# Patient Record
Sex: Female | Born: 1935 | Race: White | Hispanic: No | Marital: Married
Health system: Southern US, Community
[De-identification: ages and names within clinical notes are randomized; demographics above are authoritative.]

---

## 2004-11-21 ENCOUNTER — Ambulatory Visit: Payer: Self-pay

## 2005-01-19 ENCOUNTER — Ambulatory Visit: Payer: Self-pay | Admitting: Gastroenterology

## 2005-05-19 ENCOUNTER — Ambulatory Visit: Payer: Self-pay | Admitting: Family Medicine

## 2005-12-25 ENCOUNTER — Ambulatory Visit: Payer: Self-pay | Admitting: Family Medicine

## 2006-04-29 ENCOUNTER — Other Ambulatory Visit: Payer: Self-pay

## 2006-04-29 ENCOUNTER — Emergency Department: Payer: Self-pay | Admitting: Emergency Medicine

## 2006-05-11 ENCOUNTER — Ambulatory Visit: Payer: Self-pay

## 2007-04-03 ENCOUNTER — Ambulatory Visit: Payer: Self-pay | Admitting: Family Medicine

## 2007-04-09 ENCOUNTER — Emergency Department: Payer: Self-pay | Admitting: Emergency Medicine

## 2007-04-10 ENCOUNTER — Other Ambulatory Visit: Payer: Self-pay

## 2007-05-17 ENCOUNTER — Inpatient Hospital Stay (HOSPITAL_COMMUNITY): Admission: RE | Admit: 2007-05-17 | Discharge: 2007-05-23 | Payer: Self-pay | Admitting: Orthopedic Surgery

## 2007-05-21 ENCOUNTER — Ambulatory Visit: Payer: Self-pay | Admitting: Surgery

## 2007-05-21 ENCOUNTER — Encounter (INDEPENDENT_AMBULATORY_CARE_PROVIDER_SITE_OTHER): Payer: Self-pay | Admitting: Orthopedic Surgery

## 2007-05-23 ENCOUNTER — Encounter: Payer: Self-pay | Admitting: Internal Medicine

## 2007-06-07 ENCOUNTER — Encounter: Payer: Self-pay | Admitting: Internal Medicine

## 2007-06-11 ENCOUNTER — Ambulatory Visit: Payer: Self-pay | Admitting: Internal Medicine

## 2007-07-08 ENCOUNTER — Encounter: Payer: Self-pay | Admitting: Internal Medicine

## 2008-04-07 ENCOUNTER — Ambulatory Visit: Payer: Self-pay | Admitting: Family Medicine

## 2008-08-24 ENCOUNTER — Ambulatory Visit: Payer: Self-pay | Admitting: Unknown Physician Specialty

## 2009-05-31 ENCOUNTER — Ambulatory Visit: Payer: Self-pay | Admitting: Family Medicine

## 2009-07-23 ENCOUNTER — Inpatient Hospital Stay (HOSPITAL_COMMUNITY): Admission: EM | Admit: 2009-07-23 | Discharge: 2009-07-25 | Payer: Self-pay | Admitting: Diagnostic Neuroimaging

## 2009-07-23 ENCOUNTER — Ambulatory Visit: Payer: Self-pay | Admitting: Cardiology

## 2009-07-23 ENCOUNTER — Encounter (INDEPENDENT_AMBULATORY_CARE_PROVIDER_SITE_OTHER): Payer: Self-pay | Admitting: Diagnostic Neuroimaging

## 2009-07-23 ENCOUNTER — Emergency Department: Payer: Self-pay | Admitting: Unknown Physician Specialty

## 2009-08-27 ENCOUNTER — Encounter: Payer: Self-pay | Admitting: Internal Medicine

## 2009-08-27 ENCOUNTER — Inpatient Hospital Stay (HOSPITAL_COMMUNITY): Admission: EM | Admit: 2009-08-27 | Discharge: 2009-08-30 | Payer: Self-pay | Admitting: Emergency Medicine

## 2009-08-27 ENCOUNTER — Encounter (INDEPENDENT_AMBULATORY_CARE_PROVIDER_SITE_OTHER): Payer: Self-pay | Admitting: Internal Medicine

## 2009-08-27 ENCOUNTER — Ambulatory Visit: Payer: Self-pay | Admitting: Cardiology

## 2009-08-30 ENCOUNTER — Ambulatory Visit: Payer: Self-pay | Admitting: Physical Medicine & Rehabilitation

## 2009-08-30 ENCOUNTER — Inpatient Hospital Stay (HOSPITAL_COMMUNITY)
Admission: RE | Admit: 2009-08-30 | Discharge: 2009-09-21 | Payer: Self-pay | Admitting: Physical Medicine & Rehabilitation

## 2009-09-03 ENCOUNTER — Ambulatory Visit: Payer: Self-pay | Admitting: Psychology

## 2009-09-21 ENCOUNTER — Encounter: Payer: Self-pay | Admitting: Internal Medicine

## 2009-10-07 ENCOUNTER — Encounter: Payer: Self-pay | Admitting: Internal Medicine

## 2009-10-22 ENCOUNTER — Encounter
Admission: RE | Admit: 2009-10-22 | Discharge: 2009-10-22 | Payer: Self-pay | Source: Home / Self Care | Attending: Physical Medicine & Rehabilitation | Admitting: Physical Medicine & Rehabilitation

## 2009-11-06 ENCOUNTER — Encounter: Payer: Self-pay | Admitting: Internal Medicine

## 2009-12-07 ENCOUNTER — Encounter: Payer: Self-pay | Admitting: Internal Medicine

## 2010-01-06 ENCOUNTER — Encounter: Payer: Self-pay | Admitting: Internal Medicine

## 2010-02-06 ENCOUNTER — Encounter: Payer: Self-pay | Admitting: Internal Medicine

## 2010-03-09 ENCOUNTER — Encounter: Payer: Self-pay | Admitting: Internal Medicine

## 2010-04-22 LAB — BASIC METABOLIC PANEL
BUN: 15 mg/dL (ref 6–23)
Calcium: 8.4 mg/dL (ref 8.4–10.5)
Calcium: 8.8 mg/dL (ref 8.4–10.5)
Creatinine, Ser: 0.55 mg/dL (ref 0.4–1.2)
Creatinine, Ser: 0.54 mg/dL (ref 0.4–1.2)
Creatinine, Ser: 0.56 mg/dL (ref 0.4–1.2)
Creatinine, Ser: 0.67 mg/dL (ref 0.4–1.2)
GFR calc non Af Amer: >60 mL/min (ref >60)
GFR calc non Af Amer: >60 mL/min (ref >60)
GFR calc non Af Amer: >60 mL/min (ref >60)
Glucose, Bld: 92 mg/dL (ref 70–99)
Glucose, Bld: 86 mg/dL (ref 70–99)
Glucose, Bld: 91 mg/dL (ref 70–99)
Glucose, Bld: 91 mg/dL (ref 70–99)
Potassium: 4 mEq/L (ref 3.5–5.1)
Potassium: 4.5 mEq/L (ref 3.5–5.1)
Sodium: 128 mEq/L — ABNORMAL LOW (ref 135–145)
Sodium: 135 mEq/L (ref 135–145)

## 2010-04-22 LAB — URINE MICROSCOPIC-ADD ON

## 2010-04-22 LAB — URINALYSIS, ROUTINE W REFLEX MICROSCOPIC
Bilirubin Urine: NEGATIVE
Glucose, UA: NEGATIVE mg/dL
Nitrite: NEGATIVE
Protein, ur: NEGATIVE mg/dL
Specific Gravity, Urine: 1.012 (ref 1.005–1.030)
Urobilinogen, UA: 0.2 mg/dL (ref 0.0–1.0)

## 2010-04-22 LAB — URINE CULTURE: Culture  Setup Time: 201108011731

## 2010-04-23 LAB — COMPREHENSIVE METABOLIC PANEL
ALT: 12 U/L (ref 0–35)
AST: 16 U/L (ref 0–37)
Albumin: 3 g/dL — ABNORMAL LOW (ref 3.5–5.2)
Albumin: 3.6 g/dL (ref 3.5–5.2)
Albumin: 3.6 g/dL (ref 3.5–5.2)
Alkaline Phosphatase: 66 U/L (ref 39–117)
Alkaline Phosphatase: 70 U/L (ref 39–117)
Alkaline Phosphatase: 78 U/L (ref 39–117)
BUN: 11 mg/dL (ref 6–23)
BUN: 8 mg/dL (ref 6–23)
BUN: 8 mg/dL (ref 6–23)
CO2: 24 mEq/L (ref 19–32)
CO2: 25 mEq/L (ref 19–32)
Calcium: 8.8 mg/dL (ref 8.4–10.5)
Calcium: 9 mg/dL (ref 8.4–10.5)
Chloride: 94 mEq/L — ABNORMAL LOW (ref 96–112)
Chloride: 97 mEq/L (ref 96–112)
Chloride: 98 mEq/L (ref 96–112)
Creatinine, Ser: 0.45 mg/dL (ref 0.4–1.2)
Creatinine, Ser: 0.48 mg/dL (ref 0.4–1.2)
Creatinine, Ser: 0.53 mg/dL (ref 0.4–1.2)
GFR calc Af Amer: >60 mL/min (ref >60)
GFR calc Af Amer: >60 mL/min (ref >60)
GFR calc non Af Amer: >60 mL/min (ref >60)
GFR calc non Af Amer: >60 mL/min (ref >60)
GFR calc non Af Amer: >60 mL/min (ref >60)
Glucose, Bld: 131 mg/dL — ABNORMAL HIGH (ref 70–99)
Potassium: 3.2 mEq/L — ABNORMAL LOW (ref 3.5–5.1)
Potassium: 3.3 mEq/L — ABNORMAL LOW (ref 3.5–5.1)
Potassium: 3.4 mEq/L — ABNORMAL LOW (ref 3.5–5.1)
Sodium: 125 mEq/L — ABNORMAL LOW (ref 135–145)
Sodium: 132 mEq/L — ABNORMAL LOW (ref 135–145)
Total Bilirubin: 0.5 mg/dL (ref 0.3–1.2)
Total Bilirubin: 0.6 mg/dL (ref 0.3–1.2)
Total Protein: 6.5 g/dL (ref 6.0–8.3)
Total Protein: 6.6 g/dL (ref 6.0–8.3)

## 2010-04-23 LAB — BASIC METABOLIC PANEL
BUN: 12 mg/dL (ref 6–23)
CO2: 24 mEq/L (ref 19–32)
CO2: 27 mEq/L (ref 19–32)
Calcium: 8 mg/dL — ABNORMAL LOW (ref 8.4–10.5)
Calcium: 8.2 mg/dL — ABNORMAL LOW (ref 8.4–10.5)
Calcium: 8.3 mg/dL — ABNORMAL LOW (ref 8.4–10.5)
Chloride: 94 mEq/L — ABNORMAL LOW (ref 96–112)
Creatinine, Ser: 0.5 mg/dL (ref 0.4–1.2)
Creatinine, Ser: 0.53 mg/dL (ref 0.4–1.2)
Creatinine, Ser: 0.53 mg/dL (ref 0.4–1.2)
GFR calc Af Amer: >60 mL/min (ref >60)
GFR calc Af Amer: >60 mL/min (ref >60)
GFR calc Af Amer: >60 mL/min (ref >60)
GFR calc non Af Amer: >60 mL/min (ref >60)
GFR calc non Af Amer: >60 mL/min (ref >60)
Glucose, Bld: 110 mg/dL — ABNORMAL HIGH (ref 70–99)
Potassium: 3.3 mEq/L — ABNORMAL LOW (ref 3.5–5.1)
Sodium: 125 mEq/L — ABNORMAL LOW (ref 135–145)
Sodium: 126 mEq/L — ABNORMAL LOW (ref 135–145)

## 2010-04-23 LAB — APTT
aPTT: 28 seconds (ref 24–37)
aPTT: 29 seconds (ref 24–37)

## 2010-04-23 LAB — URINALYSIS, MICROSCOPIC ONLY
Bilirubin Urine: NEGATIVE
Glucose, UA: 100 mg/dL — AB
Nitrite: POSITIVE — AB
Specific Gravity, Urine: 1.014 (ref 1.005–1.030)
pH: 6.5 (ref 5.0–8.0)

## 2010-04-23 LAB — HEMOGLOBIN A1C
Hgb A1c MFr Bld: 5.8 % — ABNORMAL HIGH (ref <5.7)
Mean Plasma Glucose: 120 mg/dL — ABNORMAL HIGH (ref <117)

## 2010-04-23 LAB — DIFFERENTIAL
Basophils Absolute: 0 10*3/uL (ref 0.0–0.1)
Basophils Relative: 1 % (ref 0–1)
Eosinophils Absolute: 0.3 10*3/uL (ref 0.0–0.7)
Eosinophils Relative: 2 % (ref 0–5)
Eosinophils Relative: 8 % — ABNORMAL HIGH (ref 0–5)
Lymphocytes Relative: 15 % (ref 12–46)
Lymphs Abs: 1.4 10*3/uL (ref 0.7–4.0)
Neutrophils Relative %: 65 % (ref 43–77)

## 2010-04-23 LAB — CBC
HCT: 34.6 % — ABNORMAL LOW (ref 36.0–46.0)
HCT: 35.2 % — ABNORMAL LOW (ref 36.0–46.0)
HCT: 38.2 % (ref 36.0–46.0)
Hemoglobin: 11.8 g/dL — ABNORMAL LOW (ref 12.0–15.0)
Hemoglobin: 11.9 g/dL — ABNORMAL LOW (ref 12.0–15.0)
MCH: 31.1 pg (ref 26.0–34.0)
MCH: 31.2 pg (ref 26.0–34.0)
MCHC: 32.8 g/dL (ref 30.0–36.0)
MCHC: 33.9 g/dL (ref 30.0–36.0)
MCV: 91.8 fL (ref 78.0–100.0)
MCV: 92.2 fL (ref 78.0–100.0)
MCV: 92.7 fL (ref 78.0–100.0)
Platelets: 271 10*3/uL (ref 150–400)
Platelets: 280 10*3/uL (ref 150–400)
Platelets: 292 10*3/uL (ref 150–400)
RBC: 3.78 MIL/uL — ABNORMAL LOW (ref 3.87–5.11)
RBC: 3.81 MIL/uL — ABNORMAL LOW (ref 3.87–5.11)
RDW: 14.4 % (ref 11.5–15.5)
WBC: 11.6 10*3/uL — ABNORMAL HIGH (ref 4.0–10.5)
WBC: 12.2 10*3/uL — ABNORMAL HIGH (ref 4.0–10.5)

## 2010-04-23 LAB — URINE CULTURE
Colony Count: >=100000
Culture  Setup Time: 201107250925
Special Requests: POSITIVE

## 2010-04-23 LAB — SODIUM, URINE, RANDOM: Sodium, Ur: 66 mEq/L

## 2010-04-23 LAB — URINALYSIS, ROUTINE W REFLEX MICROSCOPIC
Bilirubin Urine: NEGATIVE
Nitrite: POSITIVE — AB
Protein, ur: NEGATIVE mg/dL
Urobilinogen, UA: 0.2 mg/dL (ref 0.0–1.0)

## 2010-04-23 LAB — GLUCOSE, CAPILLARY: Glucose-Capillary: 145 mg/dL — ABNORMAL HIGH (ref 70–99)

## 2010-04-23 LAB — OSMOLALITY
Osmolality: 257 mOsm/kg — ABNORMAL LOW (ref 275–300)
Osmolality: 267 mOsm/kg — ABNORMAL LOW (ref 275–300)

## 2010-04-23 LAB — CK TOTAL AND CKMB (NOT AT ARMC): CK, MB: 2.8 ng/mL (ref 0.3–4.0)

## 2010-04-23 LAB — PROTIME-INR
INR: 0.97 (ref 0.00–1.49)
Prothrombin Time: 12.8 seconds (ref 11.6–15.2)

## 2010-04-23 LAB — LIPID PANEL
Cholesterol: 134 mg/dL (ref 0–200)
HDL: 70 mg/dL (ref >39)

## 2010-04-23 LAB — CARDIAC PANEL(CRET KIN+CKTOT+MB+TROPI)
CK, MB: 2.7 ng/mL (ref 0.3–4.0)
Relative Index: INVALID (ref 0.0–2.5)
Troponin I: 0.01 ng/mL (ref 0.00–0.06)

## 2010-04-23 LAB — OSMOLALITY, URINE: Osmolality, Ur: 458 mOsm/kg (ref 390–1090)

## 2010-04-24 LAB — LIPID PANEL
HDL: 55 mg/dL (ref >39)
Total CHOL/HDL Ratio: 2.1 RATIO
Triglycerides: 63 mg/dL (ref <150)

## 2010-04-24 LAB — CBC
HCT: 33.8 % — ABNORMAL LOW (ref 36.0–46.0)
HCT: 36.2 % (ref 36.0–46.0)
Hemoglobin: 11.3 g/dL — ABNORMAL LOW (ref 12.0–15.0)
Hemoglobin: 12.3 g/dL (ref 12.0–15.0)
MCHC: 33.5 g/dL (ref 30.0–36.0)
MCHC: 33.9 g/dL (ref 30.0–36.0)
MCV: 91.6 fL (ref 78.0–100.0)
MCV: 92.6 fL (ref 78.0–100.0)
Platelets: 259 10*3/uL (ref 150–400)
RBC: 3.65 MIL/uL — ABNORMAL LOW (ref 3.87–5.11)
RBC: 3.95 MIL/uL (ref 3.87–5.11)
RDW: 14.9 % (ref 11.5–15.5)
WBC: 10 10*3/uL (ref 4.0–10.5)
WBC: 11 10*3/uL — ABNORMAL HIGH (ref 4.0–10.5)

## 2010-04-24 LAB — COMPREHENSIVE METABOLIC PANEL
Albumin: 3.3 g/dL — ABNORMAL LOW (ref 3.5–5.2)
BUN: 12 mg/dL (ref 6–23)
Chloride: 101 mEq/L (ref 96–112)
Creatinine, Ser: 0.62 mg/dL (ref 0.4–1.2)
GFR calc non Af Amer: >60 mL/min (ref >60)
Glucose, Bld: 106 mg/dL — ABNORMAL HIGH (ref 70–99)
Total Bilirubin: 0.6 mg/dL (ref 0.3–1.2)

## 2010-04-24 LAB — BASIC METABOLIC PANEL
CO2: 26 mEq/L (ref 19–32)
Chloride: 103 mEq/L (ref 96–112)
GFR calc Af Amer: >60 mL/min (ref >60)
Potassium: 3.8 mEq/L (ref 3.5–5.1)
Sodium: 134 mEq/L — ABNORMAL LOW (ref 135–145)

## 2010-04-24 LAB — CARDIAC PANEL(CRET KIN+CKTOT+MB+TROPI)
Relative Index: INVALID (ref 0.0–2.5)
Troponin I: 0.04 ng/mL (ref 0.00–0.06)
Troponin I: 0.06 ng/mL (ref 0.00–0.06)

## 2010-04-24 LAB — GLUCOSE, CAPILLARY: Glucose-Capillary: 94 mg/dL (ref 70–99)

## 2010-04-24 LAB — HEMOGLOBIN A1C
Hgb A1c MFr Bld: 6 % — ABNORMAL HIGH (ref <5.7)
Mean Plasma Glucose: 126 mg/dL — ABNORMAL HIGH (ref <117)

## 2010-04-24 LAB — PROTIME-INR: Prothrombin Time: 13.3 seconds (ref 11.6–15.2)

## 2010-06-21 NOTE — Op Note (Signed)
NAME:  Melissa Ellison, Melissa Ellison                 ACCOUNT NO.:  [id number]   MEDICAL RECORD NO.:  [id number]          PATIENT TYPE:  INP   LOCATION:  0001                         FACILITY:  [facility]   PHYSICIAN:  John L. Rendall, M.D.  DATE OF BIRTH:  1926-12-30   DATE OF PROCEDURE:  05/17/2007  DATE OF DISCHARGE:                               OPERATIVE REPORT   PREOPERATIVE DIAGNOSIS:  Avascular necrosis medial femoral condyle right  knee.   SURGICAL PROCEDURES:  Right total knee replacement with computer  navigation assistance.   POSTOPERATIVE DIAGNOSIS:  Avascular necrosis medial femoral condyle  right knee.   SURGEON:  John L. Rendall, M.D.   ASSISTANT:  Legrand Pitts. Duffy, P.A.   ANESTHESIA:  General.   PATHOLOGY:  The patient has a 1.5 x 2.5 cm defect of avascular necrosis  on the surface of the medial femoral condyle.  When all bone cuts were  made there appeared to be good bleeding bone beneath this defect but the  bone appeared to be soft.   PROCEDURE:  Under general anesthesia the right leg was prepared with  DuraPrep and draped as a sterile field.  Legs wrapped out with an  Esmarch and the tourniquet is used at 300 mm.  A medial parapatellar  incision was made.  The knee is debrided in preparation for computer  mapping.  The Schanz pins were then placed in the superior medial tibia  and the inferior medial femur.  The arrays were set up, the femoral head  is found.  The medial and lateral malleolus are found and the proximal  tibia and distal femur are mapped.  Once this is completed, the first  computer guide is used for proximal tibial resection.  This is done  within 1 degree of anatomic accuracy.  The tensioner was then inserted  and the ligaments are balanced.  Once this is measured in flexion and  extension, the first femoral cut was made on the anterior posterior  distal femur.  The second cut was then made on the distal femur with  what are at first balanced flexion and  extension gaps thought to be 12.5  on the computer, but upon putting in the spacer block, 15 fits properly.  The knee is then further debrided, removing meniscal remnants, cruciate  ligaments and taking spurs off the back of the femoral condyle.  Once  this was completed, the recessing guide is used and the proximal tibia  was exposed.  It was sized to a #4.  A center peg hole with keel is  inserted.  The standard plus femoral component was then inserted over a  15 bearing.  This was found to go into a more hyperextension than  desirable and going to a 17.6 bearing partially corrects it but if the  knee is brought into flexion and extension the soft tissues were  obviously stretching. A 20-mm bearing brought the knee within 2-1/2  degrees of anatomic alignment and failed to go into hyperextension,  stopping at near zero for extension.  At this point the bony surfaces  were prepared  with pulse irrigation.  All components were cemented in  place. After the cement was hardened and the tourniquet is let down the  knee was flexed and excess cement was removed in hyperflexion to get all  excess bone cement out of the knee.  There is a cracking sound heard and  avulsion occurred of the MCL with bone flake involving up to 40% of the  medial femoral condyle.  This is reduced anatomically. The Synthes  cannulated screw set 4.5 mm is obtained and two transverse screws 160  and 165 mm lag screws with washer are placed in this fragment.  Once  they are in place in a transverse mode perpendicular to the line of pull  of the MCL the knee is tested and as long as the knee does not flex past  90 degrees there does not appear to be any significant over pressure on  this. At this point the multiple small vessels were cauterized.  The  tourniquet was let down at an hour and 10 minutes.  The medium  Hemovac drain was inserted.  The knee was closed with #1 Tycron, #1  Vicryl, 2-0 Vicryl and skin clips.  The  patient returned to recovery in  good condition.  She will go to protected weightbearing and use of a  knee immobilizer while up and about as a precaution in view of the  fracture.      John L. Rendall, M.D.  Electronically Signed     JLR/MEDQ  D:  05/17/2007  T:  05/17/2007  Job:  [id number]

## 2010-06-21 NOTE — Discharge Summary (Signed)
NAME:  Melissa Ellison, Melissa Ellison                 ACCOUNT NO.:  [id number]   MEDICAL RECORD NO.:  [id number]          PATIENT TYPE:  INP   LOCATION:  1618                         FACILITY:  [facility]   PHYSICIAN:  John L. Rendall, M.D.  DATE OF BIRTH:  Dec 19, 1926   DATE OF ADMISSION:  05/17/2007  DATE OF DISCHARGE:  05/22/2007                               DISCHARGE SUMMARY   ADMISSION DIAGNOSES:  1. End-stage osteoarthritis, avascular necrosis, medial femoral      condyle right knee.  2. Osteoarthritis bilateral shoulders.  3. Hypertension.  4. Aortic stenosis.  5. Achalasia.  6. Hypercholesterolemia.  7. History of vertigo.  8. Deaf right ear.   DISCHARGE DIAGNOSES:  1. End-stage osteoarthritis, osteonecrosis medial femoral condyle      right knee status post right total knee arthroplasty.  2. Partial avulsion medial femoral condyle status post ORIF.  3. Acute blood loss anemia secondary to surgery.  4. Hyponatremia probable acute on chronic.  5. Hypokalemia mild.  6. Constipation.  7. Osteoarthritis bilateral shoulders.  8. Hypertension.  9. Aortic stenosis.  10.Achalasia.  11.Hypercholesterolemia.  12.History of vertigo.  13.Deaf right ear.   SURGICAL PROCEDURES:  On May 17, 2007 [name] underwent a right  total knee arthroplasty with computer navigation by Dr. Jonny Ruiz L.  Rendall  assisted by Arnoldo Morale, PA-C.  She also had repair of partial avulsion  of the MCL off the medial femoral condyle with a 4.5 leg screw.  She had  a DePuy LCS complete metal back patella cemented size standard plus  placed with an LCS complete RP insert size standard plus 20 mm  thickness.  An LCS complete primary femoral component cemented size  standard plus right and a DePuy MBP keel tibial tray cemented size #4.   COMPLICATIONS:  The only complication was the avulsion of the bone  fragment with the MCL when knee flexed, and that was fixed  intraoperatively.   CONSULTANTS:  1. Physical therapy  consult May 18, 2007.  2. Case management consult April 13 in addition to occupational      therapy consult.   HISTORY OF PRESENT ILLNESS:  This 77 year old white female patient  presented Dr. Priscille Kluver with a five month history of sudden onset of  progressive right knee pain.  The pain is a constant ache over the  medial joint line with occasional radiation into the hip.  It increases  with weightbearing and decreases with rest and Ultracet.  The knee  catches, pops, swells, and keeps her up at night.  She has failed  conservative treatment, and x-rays show arthritic changes of the knee  and avascular necrosis of the medial femoral condyle.  Because of this  she is presenting for right knee replacement.   HOSPITAL COURSE:  [name] tolerated her surgical procedure well  without immediate postoperative complications.  She was transferred to  the orthopedic floor.  Postop day #1 she had some lightheadedness, a  little bit of nausea.  She was afebrile, but her potassium was low at  2.8.  Sodium was 128.  Hemoglobin was 9,  hematocrit 27.  Potassium was  replaced. Hyponatremia was monitored, and she was transfused with 2  units of packed red blood cells.  She tolerated that well.   On postop day #2 she was feeling a little bit better but some nausea  with therapy.  Hemoglobin had improved to 10, hematocrit 29.  Sodium had  dropped to 126 so she was placed on a fluid restriction.  Potassium was  only 3.4, and that was supplemented.  She was started on therapy per  protocol.  Foley DC then switched to p.o. pain meds.   She continued to make slow progress over the next several days.  Pain  was well-controlled.  Sodium remained low around the 130 range even with  fluid restriction.  She had some complaints of GI upset, and a KUB was  ordered which showed no signs of ileus.  On the 14th T-max was 100.2,  vitals were stable.  Hemoglobin 10.8, hematocrit 31.3. Sodium 130.  She  did complain  of some calf pain so Doppler was ordered which was negative  for DVT.  She was actually making better progress with therapy, and they  felt with another day or so she would be ready for DC home.   On April 15 T-max was 99.2, vitals stable.  Sodium 129, potassium 3.4.  Leg is neurovascularly intact.  Incision well-approximated with staples.  No drainage.  It is felt she is ready for DC home and will be DC'd home  later today.   DISCHARGE DIET:  Diet:  She is to resume her regular prehospitalization  diet.   MEDICATIONS:  She is to resume her home meds as follows:   1. Evista 60 mg p.o. q.a.m..  2. Caduet 5/20 mg one tablet p.o. q.a.m.  3. Tramadol she is to hold at this time while on Percocet for pain.  4. Aspirin she is to hold at this time while on Arixtra.  5. Diclofenac she is to hold at this time on blood thinners.  6. Niacin 500 mg p.o. q.h.s.  7. V-C Forte one tablet p.o. q.a.m.  8. Calcium 600 Plus D p.o. b.i.d.  9. Hyzaar 100/25 mg p.o. q.a.m.  10.GlycoLax 17 g in 8 oz of water q.3 days.   ADDITIONAL MEDICATIONS:  1. Arixtra 2.5 mg subcu q.8 a.m. with the last dose on April 17.  On      the 18th she is to resume her baby aspirin a day.  2. Percocet 5/325 1-2 tablets p.o. q.4 h. p.r.n. for pain, 60 with no      refill.  3. Robaxin 500 mg 1-2 tablets p.o. q.6 h. p.r.n. for spasms, 50 with      no refill.   ACTIVITY:  She can be out of bed partial weightbearing 25-50% or less on  the right leg with use of the walker.  She is to have the knee  immobilizer in place on the right leg with walking.  No lifting or  driving for six weeks.  She is to have home CPM only 0 to 70 degrees at  this time.  We do not want supreme flexion with a history of that  fracture.  Please see the blue total knee discharge sheet for further  activity instructions.   WOUND CARE:  Please clean the incision with Betadine daily and apply dry  dressing.  She may shower after no drainage from the  wound for two days.  Please see the blue total knee discharge  sheet for further wound care  instructions.   FOLLOWUP:  She is to follow up with Dr. Priscille Kluver in our office on  Tuesday, April 21, and needs to call [phone] for that appointment.  She  is going to have a repeat BMET done on Friday, April 17, to check her  sodium level.  She is to continue to limit her fluids at this time to  about a liter a day, and maybe add a small amount of salt to her diet.   LABORATORY DATA:  On May 09, 2007 white count was 11.2, hemoglobin 14,  hematocrit 41.5, platelets 390.  Hemoglobin 9.6, hematocrit 27.7 on the  11th and then on April 14 it was 10.8 and 31.3.  White count went to a  high of 14.8 on the 11th and on April 14 was 10.8.  Platelets remained  within normal limits.   On April 2 sodium was 132, potassium 3.8, chloride 93, glucose 101.  On  April 11 sodium was 128 and then went to 126 on the 11th and 12th, then  to 130 on the 13th and 14th, and then 129 on the 15th.  Potassium went  from 2.8 on the 11th to 4.5 on the 13th to 3.4 on the 15th.  Sodium  ranged from 93 on the 11th to 96 on the 12th.  BUN and creatinine  remained basically within normal limits with a BUN dropped to a low of 4  and 5 on the 13th and 14th.  Glucose ranged from 173 on the 11th to 106  on the 15th.   Calcium went from 7.9 on the 11th to 8.8 on the 15th.  All other  laboratory studies were within normal limits.   Chest x-ray taken on April 2 showed no acute cardiopulmonary process  with scattered benign calcifications in the lungs and severe  degenerative changes of glenohumeral joint.  X-ray taken of the right  knee on April 10 showed the right total knee arthroplasty with no  adverse features and prior hardware removed from the distal femur and  proximal tibia with 2 cortical screws traversing the distal right  femoral condyle medial to lateral.  KUB done on April 13 showed gas  pattern within normal limits  for supine radiographs.  Lower extremity  venous Doppler done on April 14 showed no signs of DVT, SVT or Baker's  cyst.      Legrand Pitts. Duffy, P.A.      John L. Rendall, M.D.  Electronically Signed    KED/MEDQ  D:  05/22/2007  T:  05/22/2007  Job:  [id number]

## 2010-11-01 LAB — BASIC METABOLIC PANEL
BUN: 5 — ABNORMAL LOW
BUN: 7
CO2: 29
CO2: 31
CO2: 31
Calcium: 7.9 — ABNORMAL LOW
Calcium: 8.2 — ABNORMAL LOW
Calcium: 8.9
Calcium: 8.7
Calcium: 8.8
Chloride: 94 — ABNORMAL LOW
Chloride: 95 — ABNORMAL LOW
Chloride: 96
Creatinine, Ser: 0.45
Creatinine, Ser: 0.46
Creatinine, Ser: 0.53
Creatinine, Ser: 0.6
Creatinine, Ser: 0.49
GFR calc Af Amer: >60
GFR calc Af Amer: >60
GFR calc Af Amer: >60
GFR calc Af Amer: >60
GFR calc Af Amer: >60
GFR calc non Af Amer: >60
GFR calc non Af Amer: >60
GFR calc non Af Amer: >60
GFR calc non Af Amer: >60
GFR calc non Af Amer: >60
Glucose, Bld: 109 — ABNORMAL HIGH
Glucose, Bld: 173 — ABNORMAL HIGH
Glucose, Bld: 98
Potassium: 3.4 — ABNORMAL LOW
Potassium: 3.9
Potassium: 4.5
Potassium: 3.7
Sodium: 126 — ABNORMAL LOW
Sodium: 128 — ABNORMAL LOW
Sodium: 129 — ABNORMAL LOW
Sodium: 129 — ABNORMAL LOW

## 2010-11-01 LAB — CROSSMATCH
ABO/RH(D): O POS
Antibody Screen: NEGATIVE

## 2010-11-01 LAB — COMPREHENSIVE METABOLIC PANEL
AST: 25
Albumin: 3.8
CO2: 32
Calcium: 9.9
Creatinine, Ser: 0.58
GFR calc Af Amer: >60
GFR calc non Af Amer: >60

## 2010-11-01 LAB — CBC
HCT: 29.8 — ABNORMAL LOW
HCT: 29.9 — ABNORMAL LOW
MCHC: 33.7
MCHC: 34.4
MCHC: 35.2
MCV: 90
MCV: 91.3
MCV: 91.5
MCV: 91.9
Platelets: 271
Platelets: 279
Platelets: 390
RBC: 3.25 — ABNORMAL LOW
RBC: 3.32 — ABNORMAL LOW
RBC: 3.43 — ABNORMAL LOW
RDW: 13.2
RDW: 13.3
RDW: 13.8
WBC: 12.5 — ABNORMAL HIGH
WBC: 12.7 — ABNORMAL HIGH

## 2010-11-01 LAB — DIFFERENTIAL
Eosinophils Relative: 2
Lymphocytes Relative: 7 — ABNORMAL LOW
Lymphs Abs: 0.8

## 2010-11-01 LAB — PROTIME-INR: Prothrombin Time: 12.8

## 2010-11-01 LAB — URINALYSIS, ROUTINE W REFLEX MICROSCOPIC
Bilirubin Urine: NEGATIVE
Hgb urine dipstick: NEGATIVE
Specific Gravity, Urine: 1.009
pH: 7.5

## 2010-11-01 LAB — URINE CULTURE: Special Requests: NEGATIVE

## 2010-11-01 LAB — URINE MICROSCOPIC-ADD ON

## 2011-03-15 ENCOUNTER — Ambulatory Visit: Payer: Self-pay | Admitting: Family Medicine

## 2011-12-22 IMAGING — CT CT ANGIO HEAD
1 of 8 series · 2 of 35 positions shown · IV contrast (APPLIED)
Comparison: CT 08/27/2009

CLINICAL DATA: Code stroke.  Left-sided weakness.

CT ANGIOGRAPHY HEAD
TECHNIQUE: Multidetector CT imaging of the head was performed
using the standard protocol during bolus administration of
intravenous contrast.  Multiplanar CT image reconstructions
including MIPs were obtained to evaluate the vascular anatomy.
Contrast:  100 ml Omnipaque 350 IV

[Series 6: angio 2mm · axial · 0.43mm/px · z∈[-50,+10]mm · 2 of 92 slices shown]
[im 31/92  soft-tissue]
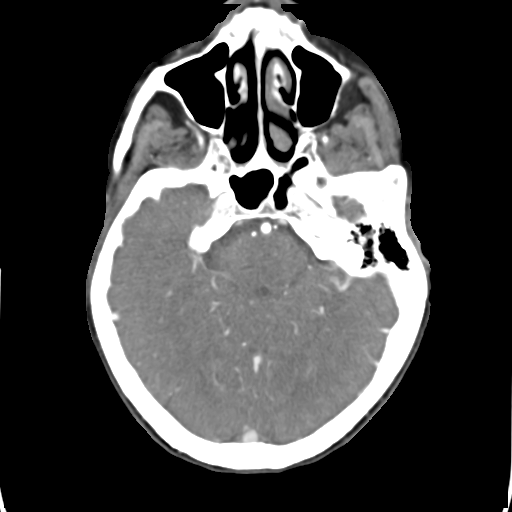
[im 61/92  bone]
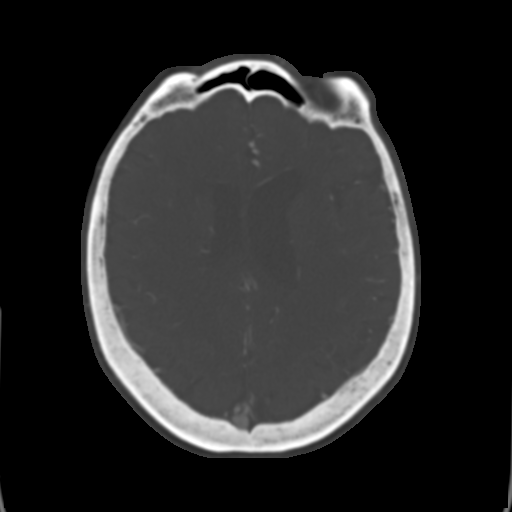

[2 of 35 positions shown; findings below may reference images not displayed]

FINDINGS: Postcontrast images of the brain reveal no enhancing
lesions.  There is atrophy and moderate chronic microvascular
ischemia in the white matter.  There is no mass lesion.

There is moderate to advanced atherosclerotic calcification
throughout the cavernous carotid bilaterally causing moderate
cavernous carotid stenosis bilaterally.  The anterior and middle
cerebral arteries are patent bilaterally with mild atherosclerotic
disease in a branch arteries.

There is atherosclerotic calcification in the vertebral arteries
bilaterally which are both patent.  PICA, and 90, superior
cerebellar, and posterior cerebral arteries are widely patent.
There is mild atherosclerotic irregularity in the posterior
cerebral arteries bilaterally without significant stenosis.

There is no vascular malformation or aneurysm.

Major dural sinuses are widely patent without thrombosis or
occlusion.

 Review of the MIP images confirms the above findings.
IMPRESSION: Intracranial atherosclerotic disease as above.  No large vessel
occlusion.

## 2012-04-24 ENCOUNTER — Ambulatory Visit: Payer: Self-pay | Admitting: Family Medicine

## 2012-11-17 ENCOUNTER — Other Ambulatory Visit: Payer: Self-pay | Admitting: Family Medicine

## 2012-11-17 LAB — URINALYSIS, COMPLETE
Bacteria: NONE SEEN
Blood: NEGATIVE
Glucose,UR: NEGATIVE mg/dL (ref 0–75)
Ketone: NEGATIVE
Leukocyte Esterase: NEGATIVE
Nitrite: NEGATIVE
Ph: 9 (ref 4.5–8.0)
Protein: NEGATIVE
RBC,UR: NONE SEEN /HPF (ref 0–5)
Squamous Epithelial: <1

## 2012-11-19 LAB — URINE CULTURE

## 2012-12-10 ENCOUNTER — Other Ambulatory Visit: Payer: Self-pay | Admitting: Family Medicine

## 2012-12-11 ENCOUNTER — Inpatient Hospital Stay: Payer: Self-pay | Admitting: Internal Medicine

## 2012-12-11 LAB — CK TOTAL AND CKMB (NOT AT ARMC): CK, Total: 49 U/L (ref 21–215)

## 2012-12-11 LAB — APTT: Activated PTT: 32.6 secs (ref 23.6–35.9)

## 2012-12-11 LAB — CBC WITH DIFFERENTIAL/PLATELET
Basophil #: 0 10*3/uL (ref 0.0–0.1)
Basophil %: 0.3 %
HGB: 14.5 g/dL (ref 12.0–16.0)
Lymphocyte %: 8.3 %
MCH: 30.8 pg (ref 26.0–34.0)
MCHC: 35 g/dL (ref 32.0–36.0)
Neutrophil #: 7 10*3/uL — ABNORMAL HIGH (ref 1.4–6.5)
Neutrophil %: 80.4 %
Platelet: 240 10*3/uL (ref 150–440)
RBC: 4.7 10*6/uL (ref 3.80–5.20)
RDW: 14.4 % (ref 11.5–14.5)

## 2012-12-11 LAB — PROTIME-INR: Prothrombin Time: 13 secs (ref 11.5–14.7)

## 2012-12-11 LAB — MAGNESIUM: Magnesium: 1.5 mg/dL — ABNORMAL LOW

## 2012-12-11 LAB — COMPREHENSIVE METABOLIC PANEL
Calcium, Total: 9.8 mg/dL (ref 8.5–10.1)
Chloride: 102 mmol/L (ref 98–107)
Co2: 26 mmol/L (ref 21–32)
Creatinine: 0.66 mg/dL (ref 0.60–1.30)
EGFR (African American): >60
Potassium: 3.6 mmol/L (ref 3.5–5.1)
SGOT(AST): 30 U/L (ref 15–37)
Sodium: 134 mmol/L — ABNORMAL LOW (ref 136–145)

## 2012-12-11 LAB — PRO B NATRIURETIC PEPTIDE: B-Type Natriuretic Peptide: 31954 pg/mL — ABNORMAL HIGH (ref 0–450)

## 2012-12-11 LAB — LIPASE, BLOOD: Lipase: 37 U/L — ABNORMAL LOW (ref 73–393)

## 2012-12-12 LAB — LIPID PANEL
Cholesterol: 116 mg/dL (ref 0–200)
HDL Cholesterol: 83 mg/dL — ABNORMAL HIGH (ref 40–60)
Ldl Cholesterol, Calc: 22 mg/dL (ref 0–100)
Triglycerides: 53 mg/dL (ref 0–200)
VLDL Cholesterol, Calc: 11 mg/dL (ref 5–40)

## 2012-12-12 LAB — CBC WITH DIFFERENTIAL/PLATELET
Basophil %: 0.5 %
Eosinophil #: 0.4 10*3/uL (ref 0.0–0.7)
Eosinophil %: 4.8 %
HCT: 39.1 % (ref 35.0–47.0)
Lymphocyte %: 13.8 %
MCV: 89 fL (ref 80–100)
Monocyte %: 19.4 %
Platelet: 218 10*3/uL (ref 150–440)
RBC: 4.38 10*6/uL (ref 3.80–5.20)
RDW: 14 % (ref 11.5–14.5)
WBC: 7.9 10*3/uL (ref 3.6–11.0)

## 2012-12-12 LAB — URINALYSIS, COMPLETE
Bacteria: NONE SEEN
Blood: NEGATIVE
Leukocyte Esterase: NEGATIVE
Nitrite: NEGATIVE
Ph: 6 (ref 4.5–8.0)
Protein: NEGATIVE
RBC,UR: <1 /HPF (ref 0–5)
Squamous Epithelial: NONE SEEN
WBC UR: 1 /HPF (ref 0–5)

## 2012-12-12 LAB — BASIC METABOLIC PANEL
Anion Gap: 4 — ABNORMAL LOW (ref 7–16)
BUN: 12 mg/dL (ref 7–18)
EGFR (African American): >60
EGFR (Non-African Amer.): >60
Sodium: 133 mmol/L — ABNORMAL LOW (ref 136–145)

## 2012-12-13 LAB — BASIC METABOLIC PANEL
Anion Gap: 5 — ABNORMAL LOW (ref 7–16)
BUN: 21 mg/dL — ABNORMAL HIGH (ref 7–18)
Calcium, Total: 8.9 mg/dL (ref 8.5–10.1)
Chloride: 101 mmol/L (ref 98–107)
EGFR (Non-African Amer.): 57 — ABNORMAL LOW
Glucose: 82 mg/dL (ref 65–99)
Sodium: 135 mmol/L — ABNORMAL LOW (ref 136–145)

## 2012-12-16 LAB — CULTURE, BLOOD (SINGLE)

## 2012-12-19 ENCOUNTER — Other Ambulatory Visit: Payer: Self-pay

## 2012-12-20 ENCOUNTER — Other Ambulatory Visit: Payer: Self-pay | Admitting: Family Medicine

## 2012-12-20 LAB — PRO B NATRIURETIC PEPTIDE: B-Type Natriuretic Peptide: 26932 pg/mL — ABNORMAL HIGH (ref 0–450)

## 2013-02-06 DEATH — deceased

## 2014-05-29 NOTE — Consult Note (Signed)
   General Aspect 81 yo female with history of hypertension, hyperlipidemia and left body cva three years ago admitted iwth progressive shortness of breath, chest pain . She is limited in her activity due to left body weakness. She goes from bed to chair. She is not able to ambulate. She recieved TPA with her first stroke which was fairly uncomplicated and subsequently suffered another left body cva several weeks later which was not amenable to tpa due to the time frame. She has a mild troponin elevation to 0.1. CXR suggested mild chf. Echo revealed mildly reduced lv tunciotn with severe aortic stenosis with AVA of approxiimatly 0.5 cm2. EF was 35-40%. She is improved somewhat with diuresis and careful antihypertensive treatment. She was on lisinopril which was discontinued after echo report was completed. She denies syncope or presyncope. She has some chest pain and sob but is unable to ambulate due to her cva   Physical Exam:  GEN thin   HEENT PERRL, hearing intact to voice   NECK supple   RESP normal resp effort  no use of accessory muscles  crackles   CARD Regular rate and rhythm  Murmur   Murmur Systolic   Systolic Murmur Out flow   ABD denies tenderness  no hernia  normal BS   LYMPH negative axillae   EXTR negative edema   SKIN normal to palpation   NEURO L side weakness   PSYCH alert   Review of Systems:  Subjective/Chief Complaint shortness of breath, weakness and fatigue   General: Fatigue  Weakness   Skin: No Complaints   ENT: No Complaints   Eyes: No Complaints   Neck: No Complaints   Respiratory: Short of breath   Cardiovascular: Dyspnea   Gastrointestinal: No Complaints   Genitourinary: No Complaints   Vascular: No Complaints   Musculoskeletal: No Complaints   Neurologic: Fainting  dense left body paralysis   Hematologic: No Complaints   Endocrine: No Complaints   Psychiatric: No Complaints   Review of Systems: All other systems were  reviewed and found to be negative   Medications/Allergies Reviewed Medications/Allergies reviewed   EKG:  EKG NSR   Abnormal NSSTTW changes    No Known Allergies:    Impression 81 yo female with history of previous cva 3 years ago, hypertension and hyperlipidemia who was admitted with complaints of shorntess of breath. She was noted to have mild chf and mild troponin elevation. EKG did not reveal any acute ischemia. Echo revealed mildly reduced lv funciton with ef of 40-45% and severe aortic valve stenosis with mild mr. CXR suggested chf. She appears to have symptomatic aortic stenosis. Long discussion with patient and multiple family members regarding intervention options for her aortic vavle. Discussed consideration for surgical intervention vs TAVR. She would need carotid evaluaiton, left and right heart cath. Pt is limited due to her dense left body weakness from prievious cvas 3 years ago. Pt is somewhat hesitant to consider any intervention. Dorice LamasFamilhy is anxious for her to be evaluated by a surgical/tavr team.   Plan 1. Continue beta blockers and gentle diuresis 2. Avoid after load reduction 3. [facility] 4. Ambulate and consider discharge if stable.   Electronic Signatures: Dalia HeadingFath, Mammie Meras A (MD)  (Signed [phone]-Nov-14 15:56)  Authored: General Aspect/Present Illness, History and Physical Exam, Review of System, EKG , Allergies, Impression/Plan   Last Updated: 07-Nov-14 15:56 by Dalia HeadingFath, Luceal Hollibaugh A (MD)

## 2014-05-29 NOTE — Discharge Summary (Signed)
PATIENT NAME:  Melissa Ellison, Melissa Ellison MR#:  [id number] DATE OF BIRTH:  1926-03-23  DATE OF ADMISSION:  12/11/2012 DATE OF DISCHARGE:  12/13/2012  ADMISSION DIAGNOSIS: Acute respiratory failure.   DISCHARGE DIAGNOSES: 1.  Acute respiratory failure secondary to acute systolic heart failure.  2.  Severe/critical aortic stenosis.  3.  History of cerebrovascular accident. 4.  Elevated troponin.  5.  History of hypertension.  6.  Hyponatremia.  7.  Hypokalemia.   CONSULTATIONS: Dr. Lady Gary.   IMAGING: The patient had a 2-D echocardiogram that showed an EF of 40% to 45% with severe/critical aortic valve stenosis, mild TR, mild to moderate MVR.   LABORATORY DATA: Sodium 135, potassium 3.5, chloride 101, bicarb 29, BUN 21, creatinine 0.92, glucose 82. Urinalysis shows no LCE or nitrites. LDL 22, cholesterol 116, triglycerides 53, HDL 83, VLDL 11.   Troponin max 0.12. Troponin at discharge 0.11.   HOSPITAL COURSE: This is an 81 year old female who presented with shortness of breath, found to have acute respiratory failure and pulmonary edema. For further details, please refer to the H and P.  1.  Acute respiratory failure. The patient had a CT of her chest which was negative for PE. However, it did show congestive heart failure with pulmonary edema and pleural effusions. She was placed on IV Lasix. She was diagnosed with pneumonia prior to her admission. She was empirically also continued on her antibiotics; however, I do not think the patient had a pneumonia. She had no symptoms of pneumonia such as fever, chills, leukocytosis or cough. Her blood cultures are negative to date. As far as her acute systolic heart failure, her blood pressures are running low normal. She diuresed with negative 760 at discharge, however, her blood pressure cannot tolerate many hypertensive medications or high dose Lasix. She is 94% on room air currently.  2.  Severe/critical aortic stenosis. Dr. Lady Gary has spoken with the family. He is  going to recommend outpatient follow-up for further evaluation for her severe/critical aortic stenosis. We are avoiding ACE inhibitors and I did discharge her with low dose of Coreg, if her blood pressure tolerates it. I did discharge her with some parameters for her beta blocker. I suspect that it is her critical aortic stenosis that triggered her into congestive heart failure and discussed with the family that she is likely to have episodes of CHF due to her aortic valve disease.  3.  History of CVA with left-sided paralysis. The patient will continue statin and Plavix.  4.  Elevated troponin, which are essentially flat. This is not acute coronary syndrome, but likely demand ischemia from her heart failure and aortic stenosis.  5.  Hypertension. Her blood pressure was actually low. We stopped the ACE inhibitor and added Coreg with parameters.  6.  Hyponatremia, possibly from CHF.  7.  Hypokalemia, which was repleted.   DISCHARGE MEDICATIONS: 1.  Coreg 3.125 mg b.i.d. only if SBP>110 HR>60 2.  Calcium and vitamin D 1 tablet b.i.d.  3.  Vitamin D2 50,000 international units monthly, on the 8th of each month.  4.  Crestor 20 mg at bedtime.  5.  Prilosec 20 mg daily.  6.  Refresh 1 drop t.i.d.  7.  MiraLax 17 grams daily.  8.  Plavix 75 mg daily.  9.  Senna 1 tablet b.i.d.  10.  Multivitamin 1 tablet daily.  11.  Pamelor 25 mg at bedtime.  12.  Gabapentin 100 mg at bedtime.  13.  Lidoderm 5% to right knee daily.  14.  Zanaflex 4 mg daily.  15.  Xanax 0.25 mg b.i.d. p.r.n. anxiety.  16.  Ambien 5 mg at bedtime p.r.n.  17.  Ultram 50 mg t.i.d. p.r.n.  18.  Nystatin triamcinolone apply topically to fingernails 3 times a day.  19.  Ultram 50 mg 2 tablets at bedtime.  20.  Lasix 20 mg daily.   DISCHARGE DIET: Low sodium.   DISCHARGE ACTIVITY: No exertion or heavy lifting.   DISCHARGE FOLLOW-UP: The patient will follow up in 1 week with Dr. Lady GaryFath, in 2 weeks with Dr. Terance HartBronstein. At that time,  Dr. Lady GaryFath will refer the patient to further evaluation for aortic stenosis.   The patient is medically stable for discharge. It should be noted that the patient's blood pressure, although low, the patient is completely asymptomatic. The patient is medically stable.   Plan of care discussed with the patient and family.  TIME SPENT: Approximately 45 minutes. ____________________________ Janyth ContesSital P. Juliene PinaMody, MD spm:sb D: 12/13/2012 14:25:14 ET T: 12/13/2012 14:48:22 ET JOB#: [id number]  cc: Lizbett Garciagarcia P. Juliene PinaMody, MD, <Dictator> Darlin PriestlyKenneth A. Lady GaryFath, MD Teena Iraniavid M. Terance HartBronstein, MD Janyth ContesSITAL P Persis Graffius MD ELECTRONICALLY SIGNED 12/13/2012 15:40

## 2014-05-29 NOTE — H&P (Signed)
PATIENT NAME:  Melissa Ellison, Melissa Ellison MR#:  [id number] DATE OF BIRTH:  November 09, 1926  DATE OF ADMISSION:  12/11/2012  PRIMARY CARE PHYSICIAN: Teena Irani. Terance Hart, MD, at [facility].  CHIEF COMPLAINT: Shortness of breath for 5 days.   HISTORY OF PRESENT ILLNESS: This is an 81 year old female with history of CVA with left-sided paralysis, hypertension, hyperlipidemia. She presents with chest pain for 5 days. She did have blood work yesterday at the facility which showed a borderline elevated troponin and a chest x-ray that showed fluid in the lungs and possible pneumonia. She was started on oral Levaquin. She was given the choice of coming into the hospital or being treated at [facility], and the patient chose to come into the hospital for further evaluation. She does not complain of any chest pain. No cough, no wheezing. In the ER on presentation, she was tachypneic with a respiratory rate of 22 and using accessory muscles to breathe. Hospitalist services were contacted for further evaluation. ER physician ordered a CT scan of the chest to rule out PE, that has not been done yet. In the ER, troponin was borderline at 0.10. White count normal range. BNP elevated at 31,954.   PAST MEDICAL HISTORY: Achalasia, CVA with left-sided paralysis, hypertension, hyperlipidemia, insomnia and anxiety.   PAST SURGICAL HISTORY: A surgical operation for achalasia, bilateral elbow surgery.   ALLERGIES: No known drug allergies.   MEDICATIONS: As per Prescription Writer, include Ambien 5 mg at bedtime p.r.n., calcium and vitamin D 500 mg/200 international units 1 tablet twice a day, Celebrex 200 mg daily, Crestor 20 mg at bedtime, gabapentin 100 mg at bedtime, Levaquin just started yesterday 500 mg at bedtime, Lidoderm patch 5% to knee, lisinopril 20 mg daily, MiraLax 17 grams daily, nystatin/triamcinolone topical to fingernails, Pamelor 25 mg at bedtime, Plavix 75 mg daily, Prilosec OTC 20 mg daily, promethazine 12.5 mg every  6 hours as needed for nausea and vomiting, Refresh eyedrops 3 times a day and as needed, Senna Plus 50/8.6 mg 1 tablet twice a day, multivitamin daily, Tylenol 650 mg at bedtime, Ultram 2 tablets at bedtime, 50 mg 3 times a day, vitamin D2 50,000 units once a month, Xanax 0.25 mg twice a day, Zanaflex 4 mg at bedtime.   SOCIAL HISTORY: No smoking. No alcohol. No drug use. Lives at [facility]. 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   FAMILY HISTORY: Sister had breast cancer. Mother died of breast cancer. Father died of a heart issue.   REVIEW OF SYSTEMS:  CONSTITUTIONAL: Positive for cold/hot feeling. No fever. No sweats. Positive for left-sided paralysis. No weight gain. No weight loss.  EYES: No blurry vision.  EARS, NOSE, MOUTH AND THROAT: No hearing loss. No sore throat. No difficulty swallowing.  CARDIOVASCULAR: No chest pain. No palpitations.  RESPIRATORY: Positive for shortness of breath. No cough. No sputum. No hemoptysis.  GASTROINTESTINAL: No nausea. No vomiting. No abdominal pain. No diarrhea. No constipation. No bright red blood per rectum. No melena.  GENITOURINARY: No burning on urination. No hematuria.  MUSCULOSKELETAL: Positive for severe arthritis pains.  PSYCHIATRIC: Positive for anxiety, depression.  INTEGUMENT: No rashes or eruptions.  NEUROLOGICAL: No fainting or blackouts.  ENDOCRINE: No thyroid problems.  HEMATOLOGIC AND LYMPHATIC: No anemia.   PHYSICAL EXAMINATION:  VITAL SIGNS: On presentation, included temperature of 98, pulse 101, respirations 20, blood pressure 170/75, pulse oximetry 94% on room air, using accessory muscles to breathe.  EYES: Conjunctivae and lids normal. Pupils equal,  round and reactive to light. Extraocular muscles intact. No nystagmus.  EARS, NOSE, MOUTH AND THROAT: Tympanic membranes: No erythema. Nasal mucosa: No erythema. Throat: No erythema, no exudate seen. Lips and gums: No lesions.  NECK: Positive JVD. No  bruits. No lymphadenopathy. No thyromegaly. No thyroid nodules palpated.  RESPIRATORY: Positive use of accessory muscles to breathe. Rales, bilateral bases.  CARDIOVASCULAR: S1 and S2, tachycardic. No gallops or rubs heard. Positive 2/6 systolic ejection murmur. Carotid upstroke 2+ bilaterally. No bruits. Dorsalis pedis pulses 2+ bilaterally. Trace edema of the lower extremities.  ABDOMEN: Soft, nontender. No organo- or splenomegaly. Normoactive bowel sounds. No masses felt.  LYMPHATIC: No lymph nodes in the neck.  MUSCULOSKELETAL: No clubbing. Trace edema. No cyanosis.  SKIN: No ulcers seen.  NEUROLOGICAL: Left-sided paralysis. Cranial nerves II through XII grossly intact.  PSYCHIATRIC: The patient is alert and oriented to person, place and time.   LABORATORY AND RADIOLOGICAL DATA: BNP 31,954. White blood cell count 8.7, H and H of 14.5 and 41.4, platelet count of 240. Glucose 93, BUN 10, creatinine 0.66, sodium 134, potassium 3.6, chloride 102, CO2 26, calcium 9.8. Liver function tests normal range. Lipase 34. Magnesium 1.5. INR 1.0. Troponin borderline at 0.10. The patient did have a d-dimer yesterday that was elevated at 1.73. The troponin was elevated at 0.14.   ASSESSMENT AND PLAN:  1. Acute respiratory failure with tachypnea, using accessory muscles to breathe. ER physician ordered a CT scan of the chest to rule out pulmonary embolism and started on a heparin drip. Most likely, this is congestive heart failure. Will obtain an echocardiogram to determine whether this is systolic or diastolic in nature. I think this is acute in nature. I will start low-dose metoprolol. The patient is already on lisinopril. Will start Lasix 20 mg IV b.i.d. Chest x-ray yesterday showed possible pneumonia. The patient was started on p.o. Levaquin. I will switch to IV while here. Get blood cultures x2. The patient has a normal white count.  2. Elevated troponin, likely secondary to respiratory failure. Will continue  aspirin at this time. The patient was started on heparin drip by ER physician. Will get serial cardiac enzymes.  3. History of cerebrovascular accident with left-sided paralysis, on Plavix.  4. Hyperlipidemia, on Crestor. Continue and check a lipid profile in the a.m.  5. Hypertension. Blood pressure is slightly elevated. Will continue usual medications and monitor.  6. Insomnia and anxiety. I did stop the patient's Zanaflex because it does interact with the metoprolol, but I did keep all the other psychiatric medications. The patient has a lot of medications at night.   TIME SPENT ON ADMISSION: 55 minutes.   CODE STATUS: The patient is a DNR.  ____________________________ Herschell Dimesichard J. Renae GlossWieting, MD rjw:lb D: 12/11/2012 14:54:58 ET T: 12/11/2012 15:03:49 ET JOB#: [id number]  cc: Herschell Dimesichard J. Renae GlossWieting, MD, <Dictator> [facility]  Salley ScarletICHARD J Nevada Kirchner MD ELECTRONICALLY SIGNED 12/12/2012 13:36
